# Patient Record
Sex: Male | Born: 1980
Health system: Southern US, Community
[De-identification: ages and names within clinical notes are randomized; demographics above are authoritative.]

## PROBLEM LIST (undated history)

## (undated) DIAGNOSIS — G4700 Insomnia, unspecified: Secondary | ICD-10-CM

## (undated) DIAGNOSIS — F4310 Post-traumatic stress disorder, unspecified: Secondary | ICD-10-CM

## (undated) HISTORY — PX: ADENOIDECTOMY: SUR15

## (undated) HISTORY — PX: SHOULDER SURGERY: SHX246

---

## 2007-04-06 ENCOUNTER — Emergency Department (HOSPITAL_COMMUNITY): Admission: EM | Admit: 2007-04-06 | Discharge: 2007-04-06 | Payer: Self-pay | Admitting: Family Medicine

## 2010-01-18 ENCOUNTER — Emergency Department (HOSPITAL_COMMUNITY)
Admission: EM | Admit: 2010-01-18 | Discharge: 2010-01-18 | Payer: Self-pay | Source: Home / Self Care | Admitting: Emergency Medicine

## 2010-04-01 LAB — DIFFERENTIAL
Basophils Absolute: 0 10*3/uL (ref 0.0–0.1)
Eosinophils Relative: 2 % (ref 0–5)
Lymphocytes Relative: 4 % — ABNORMAL LOW (ref 12–46)
Neutrophils Relative %: 86 % — ABNORMAL HIGH (ref 43–77)

## 2010-04-01 LAB — COMPREHENSIVE METABOLIC PANEL
AST: 27 U/L (ref 0–37)
BUN: 23 mg/dL (ref 6–23)
CO2: 27 mEq/L (ref 19–32)
Calcium: 9.9 mg/dL (ref 8.4–10.5)
Chloride: 100 mEq/L (ref 96–112)
Creatinine, Ser: 1.47 mg/dL (ref 0.4–1.5)
GFR calc non Af Amer: 57 mL/min — ABNORMAL LOW (ref >60)
Glucose, Bld: 108 mg/dL — ABNORMAL HIGH (ref 70–99)
Total Bilirubin: 1.3 mg/dL — ABNORMAL HIGH (ref 0.3–1.2)

## 2010-04-01 LAB — CBC
MCHC: 34.3 g/dL (ref 30.0–36.0)
WBC: 9.6 10*3/uL (ref 4.0–10.5)

## 2010-04-01 LAB — STOOL CULTURE

## 2013-10-12 ENCOUNTER — Encounter (HOSPITAL_BASED_OUTPATIENT_CLINIC_OR_DEPARTMENT_OTHER): Payer: Self-pay | Admitting: Emergency Medicine

## 2013-10-12 ENCOUNTER — Emergency Department (HOSPITAL_BASED_OUTPATIENT_CLINIC_OR_DEPARTMENT_OTHER)
Admission: EM | Admit: 2013-10-12 | Discharge: 2013-10-12 | Disposition: A | Discharge status: Home / Self Care | Payer: 59 | Attending: Emergency Medicine | Admitting: Emergency Medicine

## 2013-10-12 ENCOUNTER — Emergency Department (HOSPITAL_BASED_OUTPATIENT_CLINIC_OR_DEPARTMENT_OTHER): Payer: 59

## 2013-10-12 DIAGNOSIS — Y92410 Unspecified street and highway as the place of occurrence of the external cause: Secondary | ICD-10-CM | POA: Diagnosis not present

## 2013-10-12 DIAGNOSIS — S52109A Unspecified fracture of upper end of unspecified radius, initial encounter for closed fracture: Secondary | ICD-10-CM | POA: Insufficient documentation

## 2013-10-12 DIAGNOSIS — S6990XA Unspecified injury of unspecified wrist, hand and finger(s), initial encounter: Secondary | ICD-10-CM

## 2013-10-12 DIAGNOSIS — G4700 Insomnia, unspecified: Secondary | ICD-10-CM | POA: Insufficient documentation

## 2013-10-12 DIAGNOSIS — Z8659 Personal history of other mental and behavioral disorders: Secondary | ICD-10-CM | POA: Insufficient documentation

## 2013-10-12 DIAGNOSIS — Y9389 Activity, other specified: Secondary | ICD-10-CM | POA: Insufficient documentation

## 2013-10-12 DIAGNOSIS — S59909A Unspecified injury of unspecified elbow, initial encounter: Secondary | ICD-10-CM | POA: Diagnosis present

## 2013-10-12 DIAGNOSIS — S52122A Displaced fracture of head of left radius, initial encounter for closed fracture: Secondary | ICD-10-CM

## 2013-10-12 DIAGNOSIS — S59919A Unspecified injury of unspecified forearm, initial encounter: Secondary | ICD-10-CM

## 2013-10-12 HISTORY — DX: Insomnia, unspecified: G47.00

## 2013-10-12 HISTORY — DX: Post-traumatic stress disorder, unspecified: F43.10

## 2013-10-12 MED ORDER — HYDROCODONE-ACETAMINOPHEN 5-325 MG PO TABS: 2.0000 | ORAL_TABLET | ORAL | Status: DC | PRN

## 2013-10-12 NOTE — ED Provider Notes (Signed)
Medical screening examination/treatment/procedure(s) were performed by non-physician practitioner and as supervising physician I was immediately available for consultation/collaboration.   EKG Interpretation None        Peter Vargas Peter Azbill III, MD 10/12/13 1624 

## 2013-10-12 NOTE — Discharge Instructions (Signed)
Radial Head Fracture °A radial head fracture is a break of the smaller bone (radius) in the forearm. The head of this bone is the part near the elbow. These fractures commonly happen during a fall, when you land on an outstretched arm. These fractures are more common in middle aged adults and are common with a dislocation of the elbow. °SYMPTOMS  °· Swelling of the elbow joint and pain on the outside of the elbow. °· Pain and difficulty in bending or straightening the elbow. °· Pain and difficulty in turning the palm of the hand up or down with the elbow bent. °DIAGNOSIS  °Your caregiver may make this diagnosis by a physical exam. X-rays can confirm the type and amount of fracture. Sometimes a fracture that is not displaced cannot be seen on the original X-ray. °TREATMENT  °Radial head fractures are classified according to the amount of movement (displacement) of parts from the normal position.  °Type 1 Fractures °· Type 1 fractures are generally small fractures in which bone pieces remain together (nondisplaced fracture). °· The fracture may not be seen on initial X-rays. Usually if X-rays are repeated two to three weeks later, the fracture will show up. A splint or sling is used for a few days. Gentle early motion is used to prevent the elbow from becoming stiff. It should not be done vigorously or forced as this could displace the bone pieces. °Type 2 Fractures °· With type 2 fractures, bone pieces are slightly displaced and larger pieces of bone are broken off. °· If only a little displacement of the bone piece is present, splinting for 4 to 5 days usually works well. This is again followed with gentle active range of motion. Small fragments may be surgically removed. °· Large pieces of bone that can be put back into place will sometimes be fixed with pins or screws to hold them until the bone is healed. If this cannot be done, the fragments are removed. For older, less active people, sometimes the entire radial  head is removed if the wrist is not injured. The elbow and arm will still work fine. Soft tissue, tendon, and ligament injuries are corrected at the same time. °Type 3 Fractures °· Type 3 fractures have multiple broken pieces of bone that cannot be fixed. Surgery is usually needed to remove the broken bits of bone and what is left of the radial head. Soft-tissue damage is repaired. Gentle early motion is used to prevent the elbow from becoming stiff. Sometimes an artificial radial head can be used to prevent deformity if the elbow is unstable. °Rest, ice, elevation, immobilization, medications, and pain control are used in the early care. °HOME CARE INSTRUCTIONS  °· Keep the injured part elevated while sitting or lying down. Keep the injury above the level of your heart (the center of the chest). This will decrease swelling and pain. °· Apply ice to the injury for 15-20 minutes, 03-04 times per day while awake, for 2 days. Put the ice in a plastic bag and place a towel between the bag of ice and your cast or splint. °· Move your fingers to avoid stiffness and minimize swelling. °· If you have a plaster or fiberglass cast: °¨ Do not try to scratch the skin under the cast using sharp or pointed objects. °¨ Check the skin around the cast every day. You may put lotion on any red or sore areas. °¨ Keep your cast dry and clean. °· If you have a plaster splint: °¨   Wear the splint as directed. °¨ You may loosen the elastic around the splint if your fingers become numb, tingle, or turn cold or blue. °· Do not put pressure on any part of your cast or splint. It may break. Rest your cast only on a pillow for the first 24 hours until it is fully hardened. °· Your cast or splint can be protected during bathing with a plastic bag. Do not lower the cast or splint into the water. °· Only take over-the-counter or prescription medicines for pain, discomfort, or fever as directed by your caregiver. °· Follow all instructions for  follow-up with your caregiver. This includes any orthopedic referrals, physical therapy, and rehabilitation. Any delay in obtaining necessary care could result in a delay or failure of the bones to heal or permanent elbow stiffness. °· Do not overdo exercises. This could further damage your injury. °SEEK IMMEDIATE MEDICAL CARE IF:  °· Your cast or splint gets damaged or breaks. °· You have more severe pain or swelling than you did before getting the cast. °· You have severe pain when stretching your fingers. °· There is a bad smell, new stains, and/or pus-like (purulent) drainage coming from under the cast. °· Your fingers or hand turn pale or blue, become cold, or you lose feeling. °Document Released: 10/28/2005 Document Revised: 05/23/2013 Document Reviewed: 12/05/2008 °[employer]® Patient Information ©2015 [employer]. This information is not intended to replace advice given to you by your health care provider. Make sure you discuss any questions you have with your health care provider. ° °
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
## 2013-12-17 ENCOUNTER — Other Ambulatory Visit (HOSPITAL_BASED_OUTPATIENT_CLINIC_OR_DEPARTMENT_OTHER): Payer: Self-pay | Admitting: Orthopedic Surgery

## 2013-12-17 ENCOUNTER — Ambulatory Visit (HOSPITAL_BASED_OUTPATIENT_CLINIC_OR_DEPARTMENT_OTHER)
Admission: RE | Admit: 2013-12-17 | Discharge: 2013-12-17 | Disposition: A | Discharge status: Home / Self Care | Payer: 59 | Source: Ambulatory Visit | Attending: Orthopedic Surgery | Admitting: Orthopedic Surgery

## 2013-12-17 DIAGNOSIS — S0541XA Penetrating wound of orbit with or without foreign body, right eye, initial encounter: Secondary | ICD-10-CM

## 2013-12-17 DIAGNOSIS — S0542XA Penetrating wound of orbit with or without foreign body, left eye, initial encounter: Secondary | ICD-10-CM

## 2013-12-17 DIAGNOSIS — Z0389 Encounter for observation for other suspected diseases and conditions ruled out: Secondary | ICD-10-CM | POA: Insufficient documentation

## 2015-10-26 MED FILL — ONDANSETRON HCL 4 MG TABLET: 4 | 3 days supply | Qty: 15 | Fill #0

## 2015-10-26 MED FILL — NAPROXEN 500 MG TABLET: 500 | 30 days supply | Qty: 60 | Fill #0

## 2015-10-26 MED FILL — OXYCODONE/APAP 5/325MG: 5-325 | 4 days supply | Qty: 40 | Fill #0

## 2015-10-26 MED FILL — diazePAM 5 MG TABS: 5 | 7 days supply | Qty: 30 | Fill #0

## 2015-11-23 MED FILL — ONDANSETRON HCL 4 MG TABLET: 4 | 3 days supply | Qty: 15 | Fill #1

## 2015-11-23 MED FILL — diazePAM 5 MG TABS: 5 | 7 days supply | Qty: 30 | Fill #1

## 2016-08-05 ENCOUNTER — Ambulatory Visit
Admission: RE | Admit: 2016-08-05 | Discharge: 2016-08-05 | Disposition: A | Discharge status: Home / Self Care | Payer: No Typology Code available for payment source | Source: Ambulatory Visit | Attending: Occupational Medicine | Admitting: Occupational Medicine

## 2016-08-05 ENCOUNTER — Other Ambulatory Visit: Payer: Self-pay | Admitting: Occupational Medicine

## 2016-08-05 DIAGNOSIS — Z0289 Encounter for other administrative examinations: Secondary | ICD-10-CM

## 2016-10-02 ENCOUNTER — Emergency Department (HOSPITAL_BASED_OUTPATIENT_CLINIC_OR_DEPARTMENT_OTHER)
Admission: EM | Admit: 2016-10-02 | Discharge: 2016-10-02 | Disposition: A | Discharge status: Home / Self Care | Payer: 59 | Attending: Emergency Medicine | Admitting: Emergency Medicine

## 2016-10-02 ENCOUNTER — Encounter (HOSPITAL_BASED_OUTPATIENT_CLINIC_OR_DEPARTMENT_OTHER): Payer: Self-pay

## 2016-10-02 DIAGNOSIS — F17290 Nicotine dependence, other tobacco product, uncomplicated: Secondary | ICD-10-CM | POA: Insufficient documentation

## 2016-10-02 DIAGNOSIS — Z79899 Other long term (current) drug therapy: Secondary | ICD-10-CM | POA: Insufficient documentation

## 2016-10-02 DIAGNOSIS — Y929 Unspecified place or not applicable: Secondary | ICD-10-CM | POA: Diagnosis not present

## 2016-10-02 DIAGNOSIS — Z23 Encounter for immunization: Secondary | ICD-10-CM | POA: Insufficient documentation

## 2016-10-02 DIAGNOSIS — Y999 Unspecified external cause status: Secondary | ICD-10-CM | POA: Insufficient documentation

## 2016-10-02 DIAGNOSIS — S0592XA Unspecified injury of left eye and orbit, initial encounter: Secondary | ICD-10-CM | POA: Diagnosis present

## 2016-10-02 DIAGNOSIS — S0502XA Injury of conjunctiva and corneal abrasion without foreign body, left eye, initial encounter: Secondary | ICD-10-CM | POA: Insufficient documentation

## 2016-10-02 DIAGNOSIS — W60XXXA Contact with nonvenomous plant thorns and spines and sharp leaves, initial encounter: Secondary | ICD-10-CM | POA: Insufficient documentation

## 2016-10-02 DIAGNOSIS — Y9389 Activity, other specified: Secondary | ICD-10-CM | POA: Insufficient documentation

## 2016-10-02 MED ORDER — TETANUS-DIPHTH-ACELL PERTUSSIS 5-2.5-18.5 LF-MCG/0.5 IM SUSP
0.5000 mL | Freq: Once | INTRAMUSCULAR | Status: AC
  Administered 2016-10-02: 0.5 mL via INTRAMUSCULAR
  Filled 2016-10-02: qty 0.5

## 2016-10-02 MED ORDER — FLUORESCEIN SODIUM 0.6 MG OP STRP
1.0000 | ORAL_STRIP | Freq: Once | OPHTHALMIC | Status: AC
  Administered 2016-10-02: 1 via OPHTHALMIC
  Filled 2016-10-02: qty 1

## 2016-10-02 MED ORDER — TETRACAINE HCL 0.5 % OP SOLN
1.0000 [drp] | Freq: Once | OPHTHALMIC | Status: AC
  Administered 2016-10-02: 1 [drp] via OPHTHALMIC
  Filled 2016-10-02: qty 4

## 2016-10-02 MED ORDER — ERYTHROMYCIN 5 MG/GM OP OINT
TOPICAL_OINTMENT | Freq: Four times a day (QID) | OPHTHALMIC | Status: DC
  Administered 2016-10-02: 22:00:00 via OPHTHALMIC
  Filled 2016-10-02: qty 3.5

## 2016-10-02 MED ORDER — FLUORESCEIN SODIUM 0.6 MG OP STRP
ORAL_STRIP | OPHTHALMIC | Status: AC
  Filled 2016-10-02: qty 1

## 2016-10-02 MED ORDER — TETRACAINE HCL 0.5 % OP SOLN
2.0000 [drp] | Freq: Once | OPHTHALMIC | Status: AC
  Administered 2016-10-02: 2 [drp] via OPHTHALMIC

## 2016-10-02 MED ORDER — FLUORESCEIN SODIUM 0.6 MG OP STRP
1.0000 | ORAL_STRIP | Freq: Once | OPHTHALMIC | Status: AC
  Administered 2016-10-02: 1 via OPHTHALMIC

## 2016-10-02 NOTE — Discharge Instructions (Signed)
Ibuprofen or tylenol for pain. Cool compresses. Erythromycin ointment every 6 hrs. Follow up with ophthalmology.

## 2016-10-02 NOTE — ED Notes (Signed)
EYE EXAM IN PROGRESS

## 2016-10-02 NOTE — ED Notes (Addendum)
EDP and EDPA at [facility] 

## 2016-10-02 NOTE — ED Triage Notes (Signed)
Pt with possible tree debris to left eye approx 2pm-NAD-steady gait

## 2016-10-02 NOTE — ED Notes (Signed)
EDPA into room. Alert, NAD, calm, interactive, resps e/u, speaking in clear complete sentences, no dyspnea noted, skin W&D, L eye pain increasing again, (denies: sob, nausea, dizziness or visual changes). [facility]
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
## 2017-05-12 DIAGNOSIS — M545 Low back pain: Secondary | ICD-10-CM | POA: Diagnosis not present

## 2017-05-12 DIAGNOSIS — T148XXA Other injury of unspecified body region, initial encounter: Secondary | ICD-10-CM | POA: Diagnosis not present

## 2017-05-12 MED FILL — CYCLOBENZAPRINE HCL 10 MG T: 10 | 30 days supply | Qty: 90 | Fill #0

## 2017-05-27 MED FILL — ALPRAZolam 0.5 MG TABS: 0.5 | 30 days supply | Qty: 90 | Fill #0

## 2017-09-10 DIAGNOSIS — G4700 Insomnia, unspecified: Secondary | ICD-10-CM | POA: Diagnosis not present

## 2017-09-14 ENCOUNTER — Encounter (HOSPITAL_COMMUNITY): Payer: Self-pay

## 2017-09-14 ENCOUNTER — Other Ambulatory Visit: Payer: Self-pay

## 2017-09-14 ENCOUNTER — Emergency Department (HOSPITAL_COMMUNITY): Payer: 59

## 2017-09-14 ENCOUNTER — Emergency Department (HOSPITAL_COMMUNITY)
Admission: EM | Admit: 2017-09-14 | Discharge: 2017-09-14 | Disposition: A | Discharge status: Home / Self Care | Payer: 59 | Attending: Emergency Medicine | Admitting: Emergency Medicine

## 2017-09-14 DIAGNOSIS — S199XXA Unspecified injury of neck, initial encounter: Secondary | ICD-10-CM | POA: Diagnosis not present

## 2017-09-14 DIAGNOSIS — S0990XA Unspecified injury of head, initial encounter: Secondary | ICD-10-CM | POA: Diagnosis not present

## 2017-09-14 DIAGNOSIS — R51 Headache: Secondary | ICD-10-CM | POA: Insufficient documentation

## 2017-09-14 DIAGNOSIS — Z79899 Other long term (current) drug therapy: Secondary | ICD-10-CM | POA: Insufficient documentation

## 2017-09-14 DIAGNOSIS — R519 Headache, unspecified: Secondary | ICD-10-CM

## 2017-09-14 DIAGNOSIS — M542 Cervicalgia: Secondary | ICD-10-CM

## 2017-09-14 DIAGNOSIS — F17210 Nicotine dependence, cigarettes, uncomplicated: Secondary | ICD-10-CM | POA: Insufficient documentation

## 2017-09-14 LAB — I-STAT CHEM 8, ED
BUN: 28 mg/dL — ABNORMAL HIGH (ref 6–20)
Calcium, Ion: 1.19 mmol/L (ref 1.15–1.40)
Chloride: 104 mmol/L (ref 98–111)
Creatinine, Ser: 1.1 mg/dL (ref 0.61–1.24)
Glucose, Bld: 93 mg/dL (ref 70–99)
HCT: 44 % (ref 39.0–52.0)
Hemoglobin: 15 g/dL (ref 13.0–17.0)
Potassium: 4.4 mmol/L (ref 3.5–5.1)
Sodium: 137 mmol/L (ref 135–145)
TCO2: 24 mmol/L (ref 22–32)

## 2017-09-14 MED ORDER — IOPAMIDOL (ISOVUE-370) INJECTION 76%
50.0000 mL | Freq: Once | INTRAVENOUS | Status: AC | PRN
  Administered 2017-09-14: 50 mL via INTRAVENOUS

## 2017-09-14 MED ORDER — IOPAMIDOL (ISOVUE-370) INJECTION 76%
INTRAVENOUS | Status: AC
  Filled 2017-09-14: qty 50
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
## 2017-09-14 NOTE — ED Notes (Signed)
Pt returned from CT °

## 2017-09-14 NOTE — ED Notes (Signed)
Patient transported to CT 

## 2017-09-14 NOTE — Discharge Instructions (Signed)
Alternate 600 mg of ibuprofen and [phone] mg of Tylenol every 3 hours as needed for pain. Do not exceed 4000 mg of Tylenol daily.  Take ibuprofen with food to avoid upset stomach issues.  You can take Flexeril at night as needed for muscle spasm.  This medication may make you sleepy so I would advise against driving, drinking alcohol, or operating heavy machinery.  Apply heat, 20 minutes on 20 minutes off.  Do some gentle stretching (see attached exercises) and gentle massage to help with muscle pain and spasm.  Avoid heavy lifting or excessive activity involving the neck  for at least one week. If something hurts, stop doing it! Follow-up with your primary care physician for reevaluation of your symptoms if they persist.  Return to the emergency department if any concerning signs or symptoms develop such as worsening neck pain, weakness of the extremities, fevers, severe headache, vision changes, lightheadedness, or passing out.

## 2017-09-14 NOTE — ED Notes (Signed)
Pt transported to CT ?

## 2017-09-14 NOTE — ED Notes (Signed)
Phlebotomy at bedside.

## 2017-12-10 MED FILL — ALPRAZolam 0.5 MG TABS: 0.5 | 30 days supply | Qty: 90 | Fill #0

## 2018-01-11 MED FILL — ALPRAZolam 0.5 MG TABS: 0.5 | 30 days supply | Qty: 90 | Fill #1

## 2018-01-11 MED FILL — MODAFINIL 200 MG TABLET: 200 | 30 days supply | Qty: 30 | Fill #0

## 2018-02-12 MED FILL — MODAFINIL 200 MG TABLET: 200 | 30 days supply | Qty: 30 | Fill #1

## 2018-02-12 MED FILL — ALPRAZolam 0.5 MG TABS: 0.5 | 30 days supply | Qty: 90 | Fill #2

## 2018-03-18 MED FILL — ALPRAZolam 0.5 MG TABS: 0.5 | 30 days supply | Qty: 90 | Fill #0

## 2018-04-19 MED FILL — ALPRAZolam 0.5 MG TABS: 0.5 | 30 days supply | Qty: 90 | Fill #0

## 2018-05-27 MED FILL — ALPRAZolam 0.5 MG TABS: 0.5 | 30 days supply | Qty: 90 | Fill #0

## 2018-07-07 MED FILL — ALPRAZOLAM 0.5 MG TABS: 0.5 | 30 days supply | Qty: 90 | Fill #0

## 2018-08-09 MED FILL — ALPRAZOLAM 0.5 MG TABS: 0.5 | 30 days supply | Qty: 90 | Fill #0

## 2018-08-20 MED FILL — ALPRAZOLAM 0.5 MG TABS: 0.5 | 30 days supply | Qty: 90 | Fill #0

## 2018-09-21 MED FILL — ALPRAZOLAM 0.5 MG TABS: 0.5 | 30 days supply | Qty: 90 | Fill #0

## 2018-10-29 MED FILL — ALPRAZolam 0.5 MG TABS: 0.5 | 30 days supply | Qty: 90 | Fill #0

## 2018-11-30 MED FILL — ALPRAZolam 0.5 MG TABS: 0.5 | 30 days supply | Qty: 90 | Fill #1

## 2019-01-03 MED FILL — ALPRAZolam 0.5 MG TABS: 0.5 | 30 days supply | Qty: 90 | Fill #2

## 2020-07-26 IMAGING — CT CT CERVICAL SPINE W/O CM
3 of 4 series · 12 of 33 positions shown, 14 images · non-contrast
Comparison: None.

CLINICAL DATA: Pt reports posterior neck pain Pt reports head
injury several weeks ago. Since then he has had 3 episodes of sudden
severe sharp pain in the back of his head.

EXAM:
CT CERVICAL SPINE WITHOUT CONTRAST
TECHNIQUE: Multidetector CT imaging of the cervical spine was performed without
intravenous contrast. Multiplanar CT image reconstructions were also
generated.

[Series 6: sag bone · sagittal · 0.25mm/px · 5 of 61 slices shown, 6 images]
[im 21/61  bone]
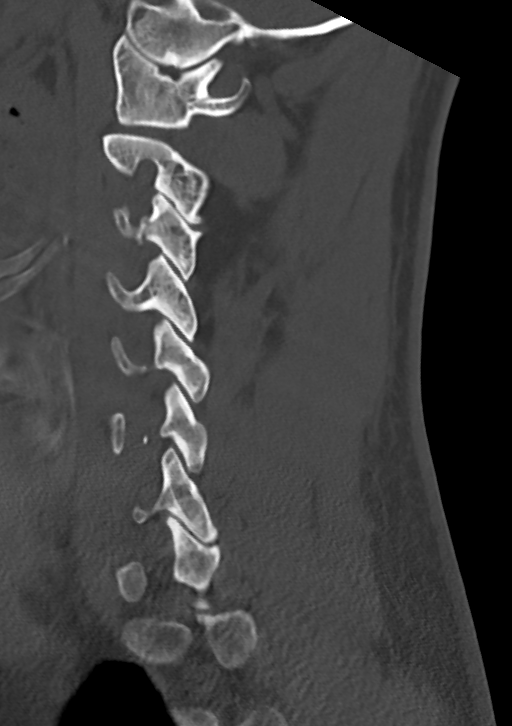
[im 26/61  bone]
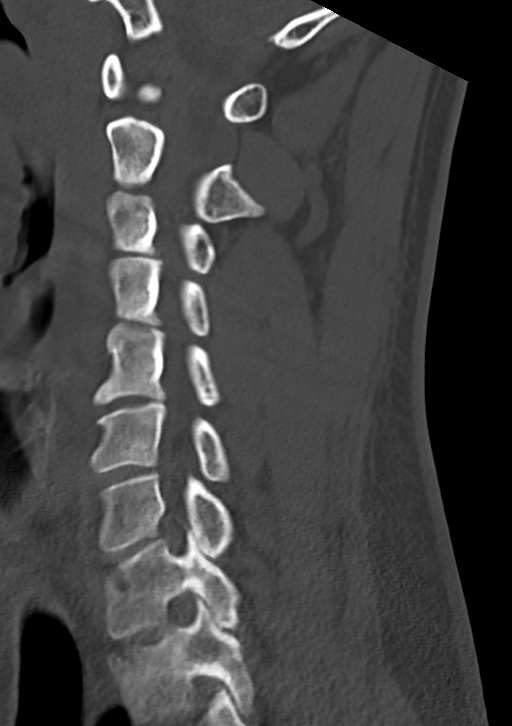
[im 31/61  soft-tissue]
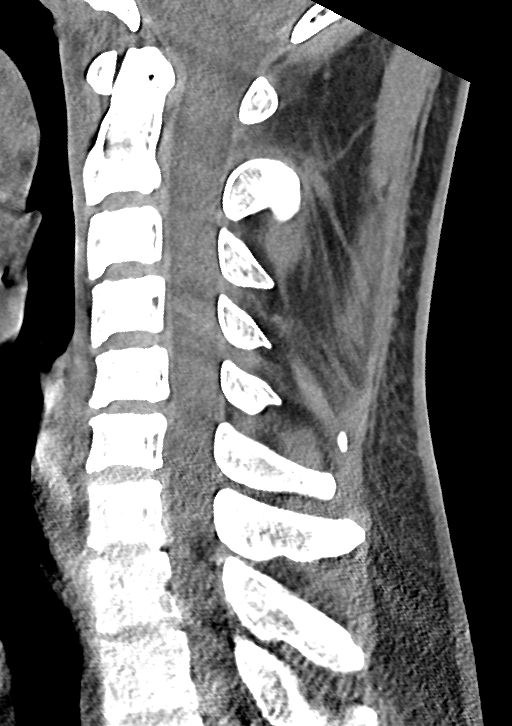
[im 31/61  bone]
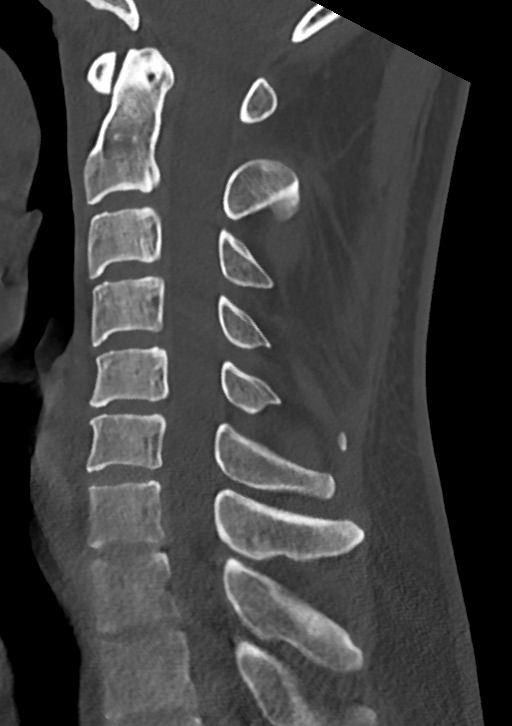
[im 36/61  bone]
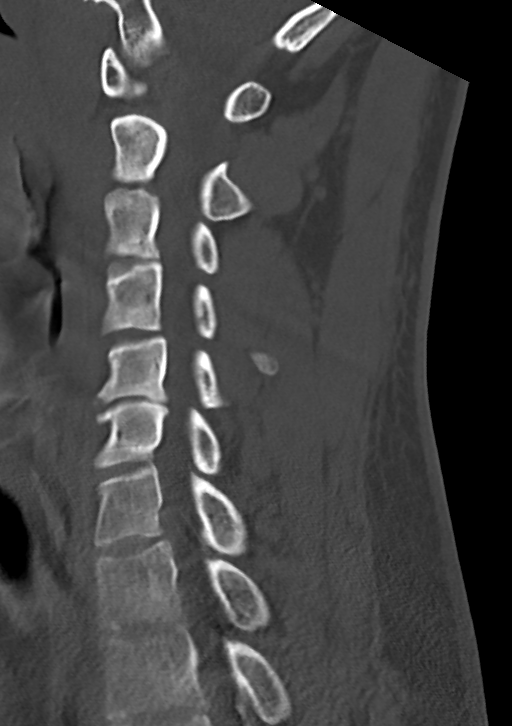
[im 41/61  bone]
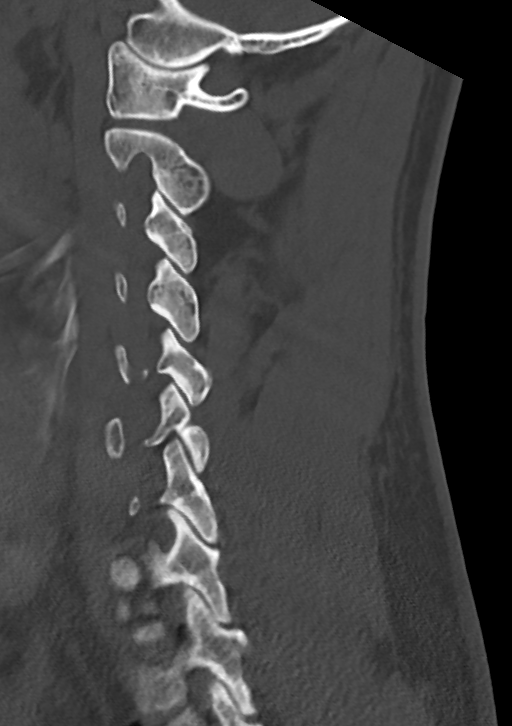

[Series 7: cor bone · coronal · 0.23mm/px · 3 of 55 slices shown]
[im 11/55  bone]
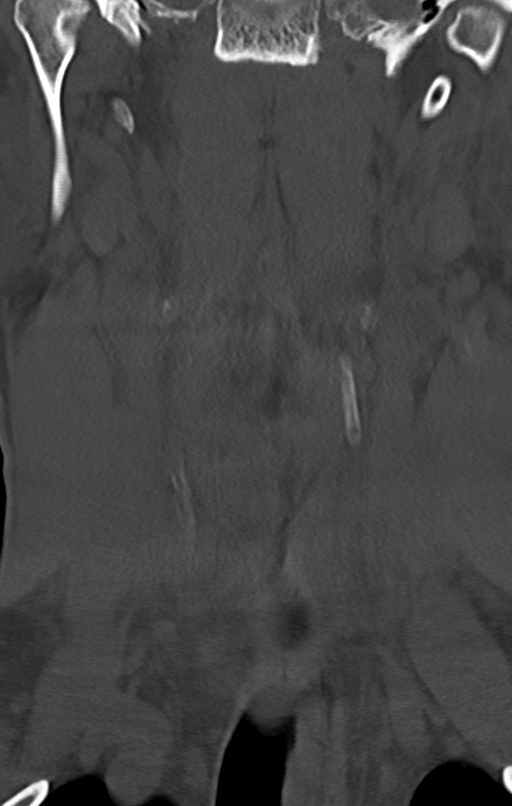
[im 22/55  bone]
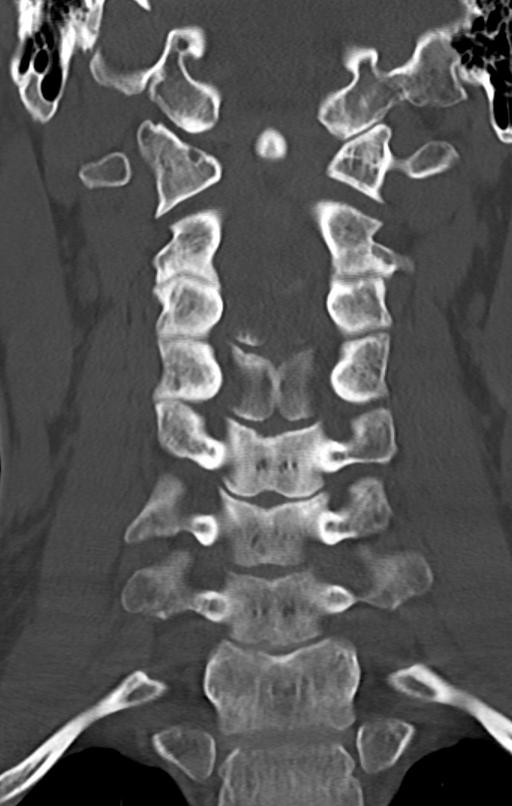
[im 33/55  bone]
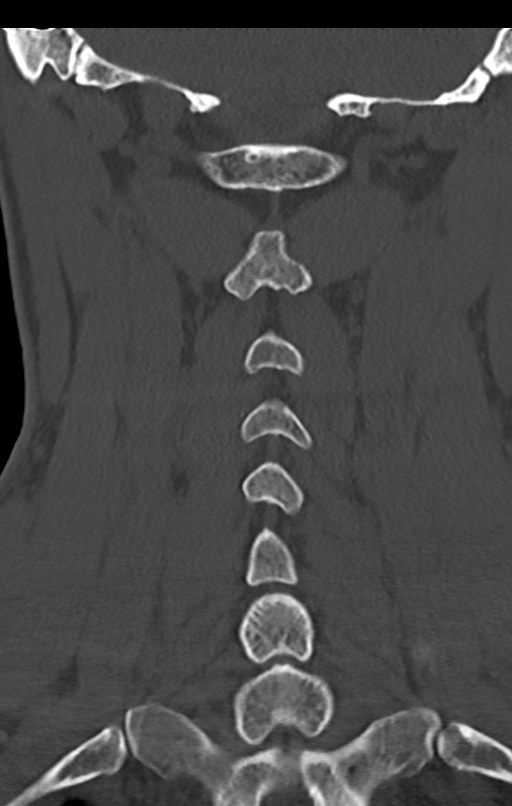

[Series 8: orthogonal axials · axial · 0.21mm/px · z∈[-212,-103]mm · 4 of 104 slices shown, 5 images]
[im 18/104  soft-tissue]
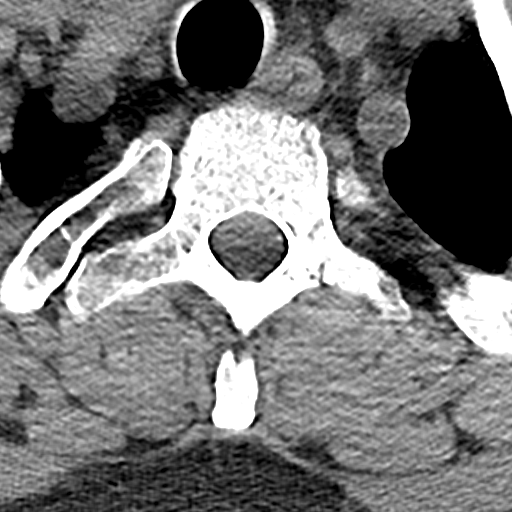
[im 18/104  bone]
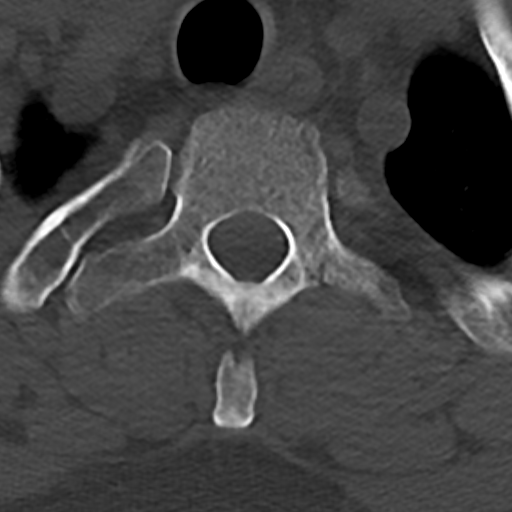
[im 35/104  bone]
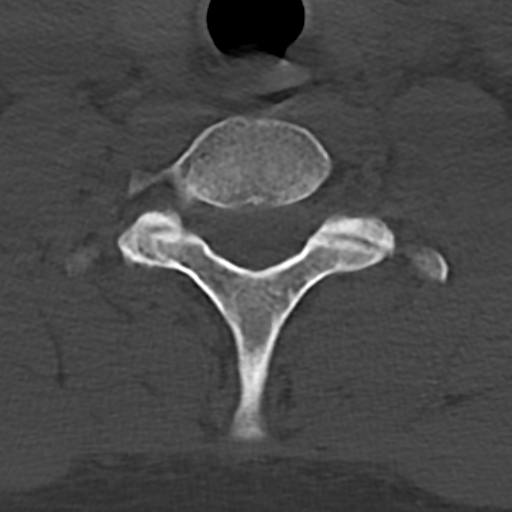
[im 69/104  bone]
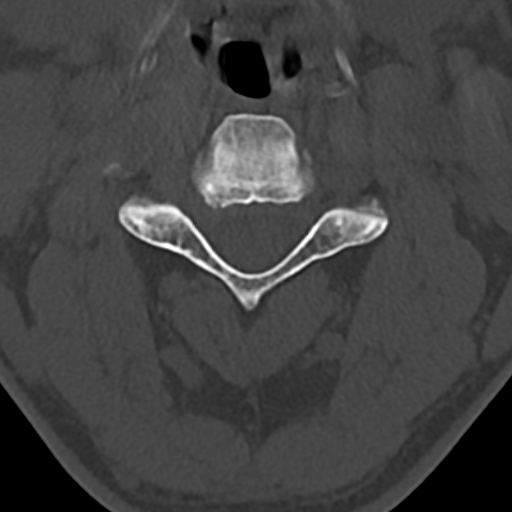
[im 86/104  bone]
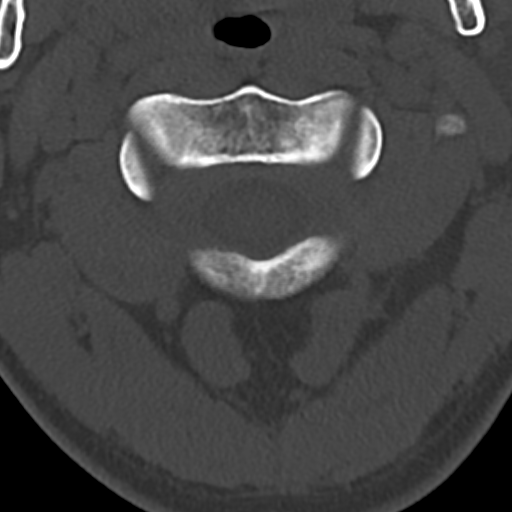

[12 of 33 positions shown; findings below may reference images not displayed]

FINDINGS: Alignment: Straightening of the normal cervical lordosis. No
spondylolisthesis.

Skull base and vertebrae: No acute fracture or dislocation. Cortical
irregularity involving right transverse and costal processes of C3.
The transverse foramen remains patent. No significant osseous
degenerative change.

Soft tissues and spinal canal: No prevertebral fluid or swelling. No
visible canal hematoma.

Disc levels: Mild narrowing of C5-6 interspace with early anterior
and posterior endplate spurring.

Upper chest: Negative.

Other: None.
IMPRESSION: 1. No acute findings.
2. Early degenerative disc disease C5-6.
3. Right transverse and costal process cortical irregularity of C[DATE] represent prominent vascular grooves or changes from remote
trauma. Findings were reviewed with Dr. Imparato, who concurs.
4. Loss of the normal cervical spine lordosis, which may be
secondary to positioning, spasm, or soft tissue injury.

## 2020-07-26 IMAGING — CT CT ANGIO NECK
1 of 12 series · 5 of 33 positions shown · IV contrast (OMNI 350)
Comparison: None.

CLINICAL DATA: Rule out vascular injury. Severe pain in the back of
head and neck after blunt trauma several weeks ago.

EXAM:
CT ANGIOGRAPHY HEAD AND NECK
TECHNIQUE: Multidetector CT imaging of the head and neck was performed using
the standard protocol during bolus administration of intravenous
contrast. Multiplanar CT image reconstructions and MIPs were
obtained to evaluate the vascular anatomy. Carotid stenosis
measurements (when applicable) are obtained utilizing NASCET
criteria, using the distal internal carotid diameter as the
denominator.
CONTRAST:  50mL 0J0ZL1-OIM IOPAMIDOL (0J0ZL1-OIM) INJECTION 76%

[Series 11: cta neck axial · axial · 0.39mm/px · z∈[-274,-6]mm · 5 of 404 slices shown]
[im 68/404  soft-tissue]
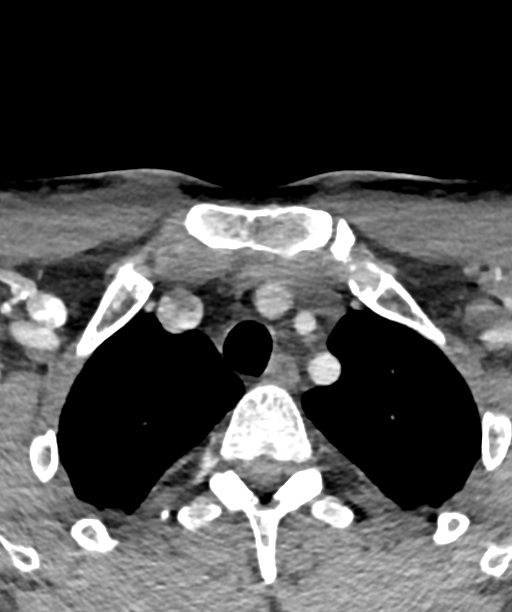
[im 135/404  bone]
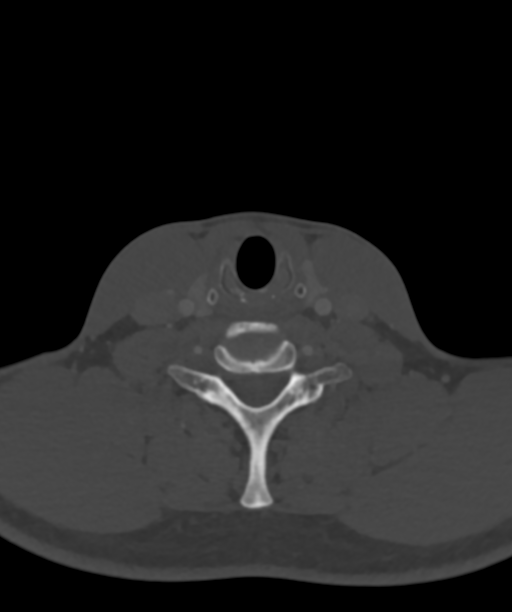
[im 202/404  soft-tissue]
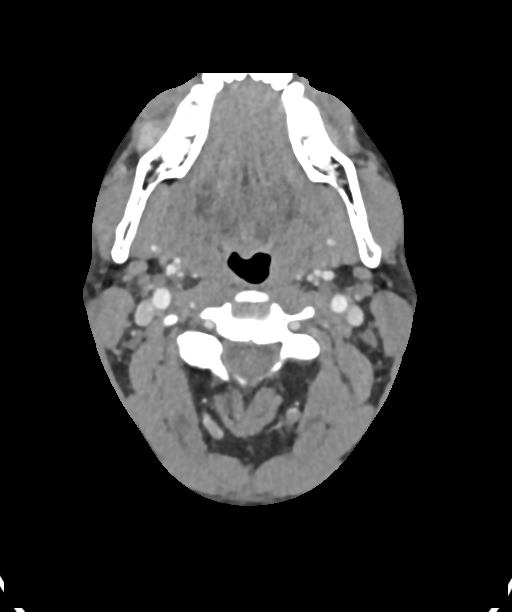
[im 269/404  bone]
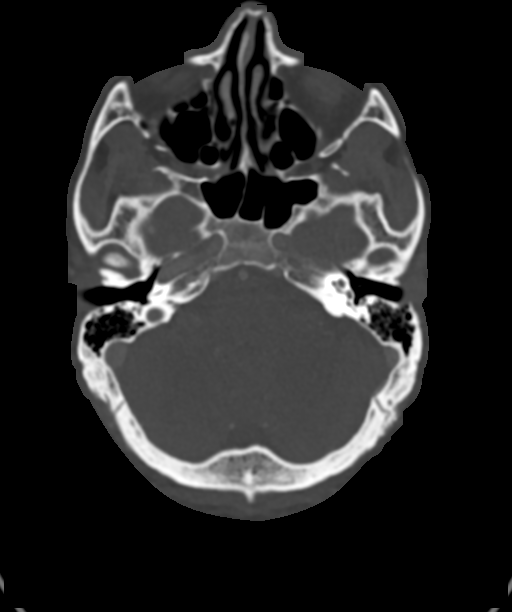
[im 336/404  soft-tissue]
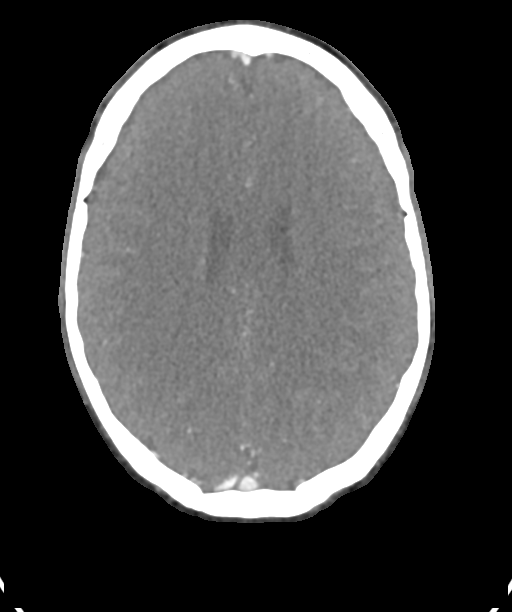

[5 of 33 positions shown; findings below may reference images not displayed]

FINDINGS: CT HEAD FINDINGS

Brain: No evidence of acute infarction, hemorrhage, hydrocephalus,
extra-axial collection or mass lesion/mass effect.

Vascular: See below

Skull: No evidence of injury

Sinuses: Negative

Orbits: Negative

Review of the MIP images confirms the above findings

CTA NECK FINDINGS

Aortic arch: Normal.  Three vessel branching.

Right carotid system: Vessels are smooth and widely patent. No
dissection or atheromatous changes.

Left carotid system: Vessels are smooth and widely patent. No
dissection or atheromatous changes.

Vertebral arteries: No proximal subclavian stenosis. Codominant
vertebral arteries that are smooth and widely patent to the dura.

Skeleton: No evidence of injury.

Other neck: Subcentimeter fatty focus in the left thyroid that is
incidental. No evidence of mass or inflammation.

Upper chest: Negative

Review of the MIP images confirms the above findings

CTA HEAD FINDINGS

Anterior circulation: Bolus quality limits vessel visualization at
and beyond the medium size arteries. No stenosis, beading,
atherosclerosis, or aneurysm seen.

Posterior circulation: No evidence of stenosis or beading. Negative
for aneurysm or atherosclerosis.

Venous sinuses: Negative

Anatomic variants: Negative

Delayed phase: No abnormal enhancement.

Review of the MIP images confirms the above findings
IMPRESSION: Negative CTA of the head neck.

## 2021-10-30 ENCOUNTER — Other Ambulatory Visit: Payer: Self-pay | Admitting: Nurse Practitioner

## 2021-10-30 ENCOUNTER — Ambulatory Visit
Admission: RE | Admit: 2021-10-30 | Discharge: 2021-10-30 | Disposition: A | Discharge status: Home / Self Care | Payer: No Typology Code available for payment source | Source: Ambulatory Visit | Attending: Nurse Practitioner | Admitting: Nurse Practitioner

## 2021-10-30 DIAGNOSIS — Z0000 Encounter for general adult medical examination without abnormal findings: Secondary | ICD-10-CM
